# Patient Record
Sex: Female | Born: 2000 | Race: Black or African American | Hispanic: No | Marital: Single
Health system: Southern US, Community
[De-identification: ages and names within clinical notes are randomized; demographics above are authoritative.]

## PROBLEM LIST (undated history)

## (undated) DIAGNOSIS — G40909 Epilepsy, unspecified, not intractable, without status epilepticus: Secondary | ICD-10-CM

## (undated) HISTORY — PX: ADENOIDECTOMY: SUR15

---
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
## 2019-04-29 ENCOUNTER — Other Ambulatory Visit: Payer: Self-pay

## 2022-04-06 ENCOUNTER — Telehealth (INDEPENDENT_AMBULATORY_CARE_PROVIDER_SITE_OTHER): Payer: Self-pay | Admitting: Pediatrics

## 2022-04-06 NOTE — Telephone Encounter (Signed)
Who's calling (name and relationship to patient) : Ebony Norworthy;mom   Best contact number: [phone](work)  Provider they see: Hickling/ Tina  Reason for call: Mom has called in wanting to speak with someone regarding Dymphna. Mom stated that Chimere tried to get her license but couldn't due to the medical block that is on file for the Epilepsy. Mom stated she is now 26 and Dr. Sharene Skeans took her off of medication when she was 14 or 15. She is wanting to know who can she get a form or letter from stating that she has not been on medicine since then and etc. Jeanise number is [phone].  Call ID:      PRESCRIPTION REFILL ONLY  Name of prescription:  Pharmacy:

## 2022-04-06 NOTE — Telephone Encounter (Signed)
MOC called to discuss previous records/notes. Patient needs "released" to obtain license due to previous diagnosis of Seizures/Epilepsy.  Advised MOC have patient call.  Note: Patient was referred to Adult Neurology for continuation or further needs.  B. Roten CMA

## 2022-04-06 NOTE — Telephone Encounter (Signed)
LM for MOC to have patient call to discuss any information/requests needed.  B. Roten CMA

## 2024-03-06 ENCOUNTER — Other Ambulatory Visit: Payer: Self-pay

## 2024-03-06 ENCOUNTER — Ambulatory Visit (HOSPITAL_COMMUNITY)
Admission: EM | Admit: 2024-03-06 | Discharge: 2024-03-06 | Disposition: A | Discharge status: Home / Self Care | Attending: Family Medicine | Admitting: Family Medicine

## 2024-03-06 ENCOUNTER — Encounter (HOSPITAL_COMMUNITY): Payer: Self-pay | Admitting: *Deleted

## 2024-03-06 DIAGNOSIS — N3090 Cystitis, unspecified without hematuria: Secondary | ICD-10-CM | POA: Diagnosis present

## 2024-03-06 DIAGNOSIS — K529 Noninfective gastroenteritis and colitis, unspecified: Secondary | ICD-10-CM | POA: Diagnosis present

## 2024-03-06 LAB — POCT URINE DIPSTICK
Bilirubin, UA: NEGATIVE
Glucose, UA: NEGATIVE mg/dL
Nitrite, UA: POSITIVE — AB
Protein Ur, POC: 30 mg/dL — AB
Spec Grav, UA: 1.02 (ref 1.010–1.025)
Urobilinogen, UA: 0.2 U/dL
pH, UA: 5.5 (ref 5.0–8.0)

## 2024-03-06 LAB — POCT URINE PREGNANCY: Preg Test, Ur: NEGATIVE

## 2024-03-06 MED ORDER — ONDANSETRON 4 MG PO TBDP
ORAL_TABLET | ORAL | Status: AC
Start: 2024-03-06 — End: 2024-03-06
  Filled 2024-03-06: qty 1

## 2024-03-06 MED ORDER — CEPHALEXIN 500 MG PO CAPS: 500.0000 mg | ORAL_CAPSULE | Freq: Two times a day (BID) | ORAL | 0 refills | Status: AC

## 2024-03-06 MED ORDER — ONDANSETRON 4 MG PO TBDP: 4.0000 mg | ORAL_TABLET | Freq: Three times a day (TID) | ORAL | 0 refills | Status: AC | PRN

## 2024-03-06 MED ORDER — ONDANSETRON 4 MG PO TBDP
4.0000 mg | ORAL_TABLET | Freq: Once | ORAL | Status: AC
  Administered 2024-03-06: 4 mg via ORAL

## 2024-03-06 NOTE — ED Triage Notes (Signed)
 C/O starting with abd pain after eating around 1400 yesterday, then yesterday evening started with vomiting and diarrhea. States hasn't been able to keep down many PO fluids. C/O hot flashes.

## 2024-03-06 NOTE — Discharge Instructions (Addendum)
You have had labs (urine culture) sent today. We will call you with any significant abnormalities or if there is need to begin or change treatment or pursue further follow up.  You may also review your test results online through MyChart. If you do not have a MyChart account, instructions to sign up should be on your discharge paperwork.  Please do your best to ensure adequate fluid intake in order to avoid dehydration. If you find that you are unable to tolerate drinking fluids regularly please proceed to the Emergency Department for evaluation.

## 2024-03-06 NOTE — ED Provider Notes (Signed)
 [facility]   [id number] 03/06/24 Arrival Time: 1601  ASSESSMENT & PLAN:  1. Cystitis   2. Gastroenteritis    Tolerating PO fluids.  Meds ordered this encounter  Medications   ondansetron (ZOFRAN-ODT) disintegrating tablet 4 mg   cephALEXin (KEFLEX) 500 MG capsule    Sig: Take 1 capsule (500 mg total) by mouth 2 (two) times daily.    Dispense:  10 capsule    Refill:  0   ondansetron (ZOFRAN-ODT) 4 MG disintegrating tablet    Sig: Take 1 tablet (4 mg total) by mouth every 8 (eight) hours as needed for nausea or vomiting.    Dispense:  15 tablet    Refill:  0   Urine culture pending. Benign abdomen. Discussed typical duration of symptoms for suspected viral GI illness. Will do her best to ensure adequate fluid intake in order to avoid dehydration. Will proceed to the Emergency Department for evaluation if unable to tolerate PO fluids regularly.    Discharge Instructions      You have had labs (urine culture) sent today. We will call you with any significant abnormalities or if there is need to begin or change treatment or pursue further follow up.  You may also review your test results online through MyChart. If you do not have a MyChart account, instructions to sign up should be on your discharge paperwork.  Please do your best to ensure adequate fluid intake in order to avoid dehydration. If you find that you are unable to tolerate drinking fluids regularly please proceed to the Emergency Department for evaluation.        Reviewed expectations re: course of current medical issues. Questions answered. Outlined signs and symptoms indicating need for more acute intervention. Patient verbalized understanding. After Visit Summary given.   SUBJECTIVE: History from: patient.  Colleen Valenzuela is a 23 y.o. female who presents with complaint of non-bilious, non-bloody intermittent nausea with vomiting; with non-bloody diarrhea. Onset yesterday. Abdominal  discomfort: mild and cramping. Symptoms are unchanged since beginning. Aggravating factors: eating. Alleviating factors: none. Associated symptoms: none. She denies fever. Appetite: decreased. PO intake: decreased. Ambulatory without assistance. Urinary symptoms: denies. Sick contacts: none. Recent travel or camping: none. OTC treatment: none.  Patient's last menstrual period was 02/07/2024 (exact date).  Past Surgical History:  Procedure Laterality Date   ADENOIDECTOMY      ROS: As per HPI.  OBJECTIVE:  Vitals:   03/06/24 1736  BP: 103/69  Pulse: 94  Resp: 18  Temp: 98.1 F (36.7 C)  TempSrc: Oral  SpO2: 98%    General appearance: alert; no distress Oropharynx: moist Lungs: clear to auscultation bilaterally; unlabored Heart: regular rate and rhythm Abdomen: soft; non-distended; no guarding or rebound TTP Back: no CVA tenderness Extremities: no edema; symmetrical with no gross deformities Skin: warm; dry Neurologic: normal gait Psychological: alert and cooperative; normal mood and affect  Labs: Results for orders placed or performed during the hospital encounter of 03/06/24  POC Urinalysis Dipstick   Collection Time: 03/06/24  5:50 PM  Result Value Ref Range   Color, UA yellow yellow   Clarity, UA hazy (A) clear   Glucose, UA negative negative mg/dL   Bilirubin, UA negative negative   Ketones, POC UA trace (5) (A) negative mg/dL   Spec Grav, UA 8.979 8.989 - 1.025   Blood, UA moderate (A) negative   pH, UA 5.5 5.0 - 8.0   Protein Ur, POC =30 (A) negative mg/dL   Urobilinogen, UA 0.2  0.2 or 1.0 E.U./dL   Nitrite, UA Positive (A) Negative   Leukocytes, UA Small (1+) (A) Negative  POCT urine pregnancy   Collection Time: 03/06/24  5:54 PM  Result Value Ref Range   Preg Test, Ur Negative Negative   Labs Reviewed  POCT URINE DIPSTICK - Abnormal; Notable for the following components:      Result Value   Clarity, UA hazy (*)    Ketones, POC UA trace (5) (*)     Blood, UA moderate (*)    Protein Ur, POC =30 (*)    Nitrite, UA Positive (*)    Leukocytes, UA Small (1+) (*)    All other components within normal limits  POCT URINE PREGNANCY - Normal  URINE CULTURE    Imaging: No results found.  No Known Allergies                                             Past Medical History:  Diagnosis Date   Epilepsy (HCC)    Social History   Socioeconomic History   Marital status: Single    Spouse name: Not on file   Number of children: Not on file   Years of education: Not on file   Highest education level: Not on file  Occupational History   Not on file  Tobacco Use   Smoking status: Passive Smoke Exposure - Never Smoker   Smokeless tobacco: Never   Tobacco comments:    Mom Smokes  Vaping Use   Vaping status: Never Used  Substance and Sexual Activity   Alcohol use: Not Currently    Comment: occasionally   Drug use: No   Sexual activity: Yes    Birth control/protection: Condom  Other Topics Concern   Not on file  Social History Narrative   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 She is doing well. She lives with her mom and siblings. She has one brother, 37 yo, and one sister, 39 yo. She enjoys eating, sleeping, and running track.   Social Drivers of Corporate Investment Banker Strain: Not on file  Food Insecurity: Not on file  Transportation Needs: Not on file  Physical Activity: Not on file  Stress: Not on file  Social Connections: Not on file  Intimate Partner Violence: Not on file   History reviewed. No pertinent family history.    Rolinda Rogue, MD 03/06/24 1910

## 2024-03-07 LAB — URINE CULTURE: Culture: >=100000 — AB

## 2024-03-10 ENCOUNTER — Ambulatory Visit (HOSPITAL_COMMUNITY): Payer: Self-pay
